# Patient Record
Sex: Male | Born: 1990 | Race: Black or African American | Hispanic: No | Smoking: Never smoker
Health system: Southern US, Community
[De-identification: ages and names within clinical notes are randomized; demographics above are authoritative.]

---

## 2016-05-21 ENCOUNTER — Other Ambulatory Visit: Payer: Self-pay | Admitting: Occupational Medicine

## 2016-05-21 ENCOUNTER — Ambulatory Visit: Payer: Self-pay

## 2016-05-21 DIAGNOSIS — Z021 Encounter for pre-employment examination: Secondary | ICD-10-CM

## 2018-06-16 ENCOUNTER — Emergency Department (HOSPITAL_COMMUNITY): Payer: 59

## 2018-06-16 ENCOUNTER — Emergency Department (HOSPITAL_COMMUNITY)
Admission: EM | Admit: 2018-06-16 | Discharge: 2018-06-16 | Disposition: A | Discharge status: Home / Self Care | Payer: 59 | Attending: Emergency Medicine | Admitting: Emergency Medicine

## 2018-06-16 DIAGNOSIS — S61219A Laceration without foreign body of unspecified finger without damage to nail, initial encounter: Secondary | ICD-10-CM

## 2018-06-16 DIAGNOSIS — W260XXA Contact with knife, initial encounter: Secondary | ICD-10-CM | POA: Diagnosis not present

## 2018-06-16 DIAGNOSIS — Y939 Activity, unspecified: Secondary | ICD-10-CM | POA: Insufficient documentation

## 2018-06-16 DIAGNOSIS — Y999 Unspecified external cause status: Secondary | ICD-10-CM | POA: Insufficient documentation

## 2018-06-16 DIAGNOSIS — Y929 Unspecified place or not applicable: Secondary | ICD-10-CM | POA: Diagnosis not present

## 2018-06-16 DIAGNOSIS — S61216A Laceration without foreign body of right little finger without damage to nail, initial encounter: Secondary | ICD-10-CM | POA: Insufficient documentation

## 2018-06-16 MED ORDER — LIDOCAINE HCL 2 % IJ SOLN
5.0000 mL | Freq: Once | INTRAMUSCULAR | Status: AC
  Administered 2018-06-16: 100 mg

## 2018-06-16 MED ORDER — CEPHALEXIN 500 MG PO CAPS: 500.0000 mg | ORAL_CAPSULE | Freq: Four times a day (QID) | ORAL | 0 refills | Status: AC

## 2018-06-16 NOTE — ED Notes (Signed)
Patient verbalizes understanding of discharge instructions. Opportunity for questioning and answers were provided. Armband removed by staff, pt discharged from ED. Dressing applied to sutures prior to pt discharge, instructions and supplies given, and prescription reviewed.

## 2018-06-16 NOTE — ED Provider Notes (Addendum)
[facility] Note   CSN: [id number] Arrival date & time: 06/16/18  1240     History   Chief Complaint Chief Complaint  Patient presents with  . Laceration    HPI Cole Cooke is a 27 y.o. male.  Patient is a 27 year old healthy male without medical problems presenting today with a laceration to his right pinky finger.  Patient is right-handed and states he was cooking and grabbed a knife which cut his finger.  He has no other injury at this time.  He does not take any anticoagulation.  The history is provided by the patient.  Laceration   The incident occurred less than 1 hour ago. The laceration is located on the right hand. The laceration is 2 cm in size. The laceration mechanism was a a clean knife. The pain is at a severity of 2/10. The pain is mild. The pain has been constant since onset. He reports no foreign bodies present. His tetanus status is UTD.    No past medical history on file.  There are no active problems to display for this patient.        Home Medications    Prior to Admission medications   Not on File    Family History No family history on file.  Social History Social History   Tobacco Use  . Smoking status: Not on file  Substance Use Topics  . Alcohol use: Not on file  . Drug use: Not on file     Allergies   Patient has no allergy information on record.   Review of Systems Review of Systems  All other systems reviewed and are negative.    Physical Exam Updated Vital Signs BP 100/79 (BP Location: Right Arm)   Pulse 86   Temp 98.7 F (37.1 C) (Oral)   Resp 16   Ht 5\' 10"  (1.778 m)   Wt 90.7 kg   SpO2 100%   BMI 28.70 kg/m   Physical Exam  Constitutional: He is oriented to person, place, and time. He appears well-developed and well-nourished. No distress.  HENT:  Head: Normocephalic and atraumatic.  Eyes: Pupils are equal, round, and reactive to light. Conjunctivae  are normal.  Cardiovascular: Normal rate.  Pulmonary/Chest: Effort normal.  Musculoskeletal:       Hands: Neurological: He is alert and oriented to person, place, and time.  Skin: Skin is warm and dry. Capillary refill takes less than 2 seconds. No erythema.  Psychiatric: He has a normal mood and affect. His behavior is normal.  Nursing note and vitals reviewed.    ED Treatments / Results  Labs (all labs ordered are listed, but only abnormal results are displayed) Labs Reviewed - No data to display  EKG None  Radiology Dg Finger Little Right  Result Date: 06/16/2018 CLINICAL DATA:  Laceration to mid fifth finger. EXAM: RIGHT LITTLE FINGER 2+V COMPARISON:  None. FINDINGS: Soft tissue changes over the fifth finger compatible recent laceration. No foreign body. No underlying fracture or dislocation. IMPRESSION: Soft tissue laceration fifth finger.  No fracture or foreign body. Electronically Signed   By: Elberta Fortisaniel  Boyle M.D.   On: 06/16/2018 13:40    Procedures Procedures (including critical care time)  LACERATION REPAIR Performed by: [employer]Whitney Kaydra Borgen Authorized by: Gwyneth SproutWhitney Man Bonneau Consent: Verbal consent obtained. Risks and benefits: risks, benefits and alternatives were discussed Consent given by: patient Patient identity confirmed: provided demographic data Prepped and Draped in normal sterile fashion Wound explored  Laceration  Location: right 5th finger  Laceration Length: 3cm  No Foreign Bodies seen or palpated  Anesthesia: digital block infiltration  Local anesthetic: lidocaine 1% without epinephrine  Anesthetic total: 2 ml  Irrigation method: syringe Amount of cleaning: standard  Skin closure: 3.0 vicryl rapide  Number of sutures: 4  Technique: simple interrupted  Patient tolerance: Patient tolerated the procedure well with no immediate complications.   Medications Ordered in ED Medications - No data to display   Initial Impression / Assessment  and Plan / ED Course  I have reviewed the triage vital signs and the nursing notes.  Pertinent labs & imaging results that were available during my care of the patient were reviewed by me and considered in my medical decision making (see chart for details).     Patient who is right-handed presenting with a deep laceration to the fifth digit with evidence of flexor tendon injury.  X-rays pending.  Hand surgery consulted.  1:47 PM X-ray wnl.  Hand surgery evaluated and will plan on repair in the future.  Final Clinical Impressions(s) / ED Diagnoses   Final diagnoses:  Laceration of right little finger without foreign body without damage to nail, initial encounter  Finger laceration involving tendon, initial encounter    ED Discharge Orders         Ordered    cephALEXin (KEFLEX) 500 MG capsule  4 times daily     06/16/18 1522           Gwyneth Sprout, MD 06/16/18 1348    Gwyneth Sprout, MD 06/16/18 [phone]

## 2018-06-16 NOTE — Consult Note (Addendum)
Reason for Consult:Right little finger laceration Referring Physician: W Festus Aloelunkett  Cole Cooke is an 27 y.o. male.  HPI: Cole Cooke was grabbing a knife at work by the blade and cut his right little finger fairly severely. He came to the ED for evaluation and was noted to have a flexion deficit and hand surgery was consulted. He has some N/T in the tip of the finger. 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  No past medical history on file.  No family history on file.  Social History:  has no tobacco, alcohol, and drug history on file.  Allergies: Allergies not on file  Medications: I have reviewed the patient's current medications.  No results found for this or any previous visit (from the past 48 hour(s)).  No results found.  Review of Systems  Constitutional: Negative for weight loss.  HENT: Negative for ear discharge, ear pain, hearing loss and tinnitus.   Eyes: Negative for blurred vision, double vision, photophobia and pain.  Respiratory: Negative for cough, sputum production and shortness of breath.   Cardiovascular: Negative for chest pain.  Gastrointestinal: Negative for abdominal pain, nausea and vomiting.  Genitourinary: Negative for dysuria, flank pain, frequency and urgency.  Musculoskeletal: Positive for joint pain (Right little finger). Negative for back pain, falls, myalgias and neck pain.  Neurological: Negative for dizziness, tingling, sensory change, focal weakness, loss of consciousness and headaches.  Endo/Heme/Allergies: Does not bruise/bleed easily.  Psychiatric/Behavioral: Negative for depression, memory loss and substance abuse. The patient is not nervous/anxious.    Blood pressure 100/79, pulse 86, temperature 98.7 F (37.1 C), temperature source Oral, resp. rate 16, height 5\' 10"  (1.778 m), weight 90.7 kg, SpO2 100 %. Physical Exam  Constitutional: He appears well-developed and well-nourished. No distress.  HENT:  Head: Normocephalic and atraumatic.   Eyes: Conjunctivae are normal. Right eye exhibits no discharge. Left eye exhibits no discharge. No scleral icterus.  Neck: Normal range of motion.  Cardiovascular: Normal rate and regular rhythm.  Respiratory: Effort normal. No respiratory distress.  Musculoskeletal:  Right shoulder, elbow, wrist, digits- Transverse laceration over volar PIP joint of little finger, no DIP flexion, 4/5 PIP flexion, paresthesia radial aspect of distal finger, no instability, no blocks to motion  Sens  Ax/R/M/U intact except as above  Mot   Ax/ R/ PIN/ M/ AIN/ U intact except as above  Rad 2+  Neurological: He is alert.  Skin: Skin is warm and dry. He is not diaphoretic.  Psychiatric: He has a normal mood and affect. His behavior is normal.    Assessment/Plan: Right little finger laceration with flexor tendon, nerve injury -- Dr. Janee Mornhompson to evaluate in the ED.    Freeman CaldronMichael J. Jeffery, PA-C Orthopedic Surgery [phone] 06/16/2018, 1:36 PM   R SF xverse palmar laceration at distal P1, hemostatic.  No active DIP flexion, but can flex PIP to FDS testing.  Normal LT sens ulnarly, but altered radially Only NPO since 11:15 am  Suspect R SF FDP lac and RDN lac.  Recommend 1st aid measures in ED (wound cleansing/irrigation/closure).  Tetanus is UTD.  Recommend PO antibiotics, and my office will contact patient to begin process of seeking W/C approval for surgical repair on Friday.  G/R/O and expected rehab (therapy, etc) reviewed and consent obtained.  Neil Crouchave Menelik Mcfarren, MD Hand Surgery

## 2018-06-16 NOTE — Discharge Instructions (Signed)
Make sure the cut is covered during the day.  Try to keep it dry.  Plan on surgery on Friday

## 2019-11-10 IMAGING — DX DG FINGER LITTLE 2+V*R*
3 series · 3 of 3 positions shown · non-contrast
Comparison: None.

CLINICAL DATA: Laceration to mid fifth finger.

EXAM:
RIGHT LITTLE FINGER 2+V

[finger ap]
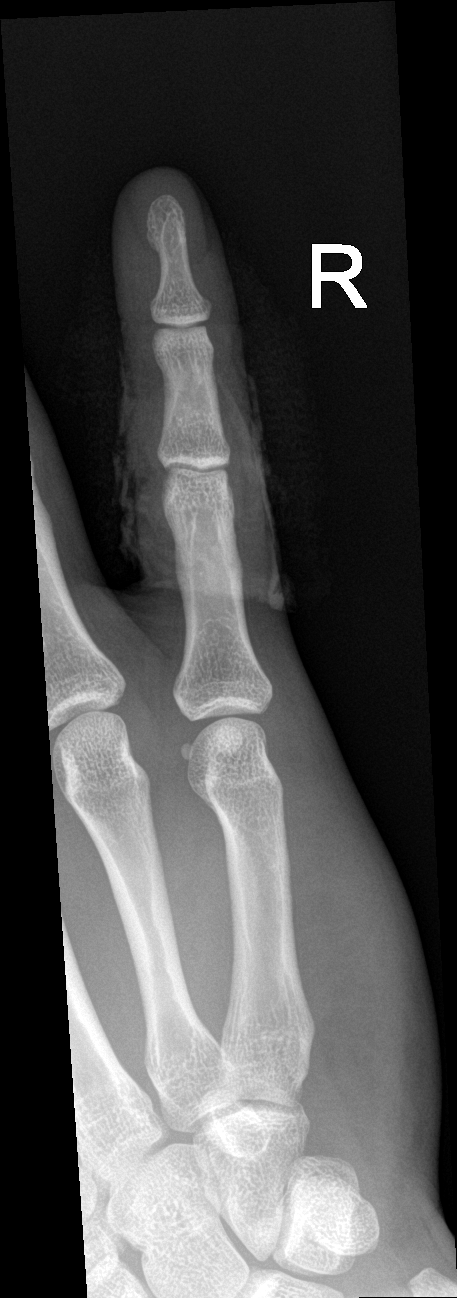

[finger obl]
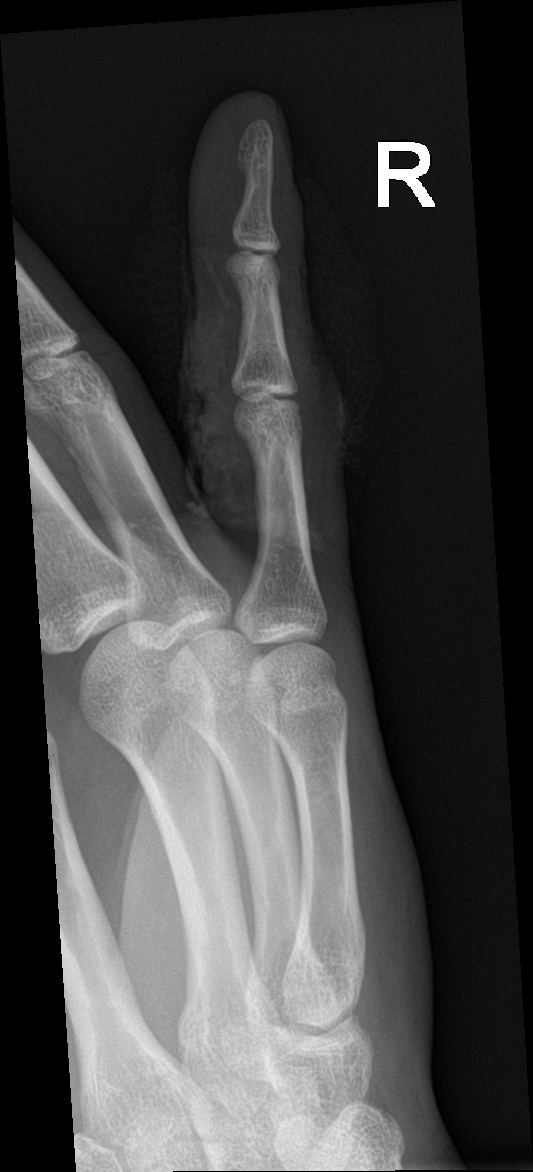

[finger lat]
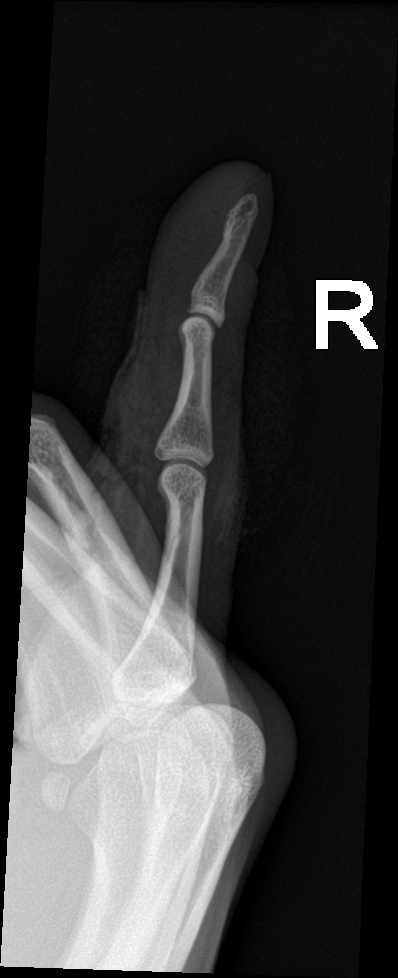

[3 of 3 positions shown; findings below may reference images not displayed]

FINDINGS: Soft tissue changes over the fifth finger compatible recent
laceration. No foreign body. No underlying fracture or dislocation.
IMPRESSION: Soft tissue laceration fifth finger.  No fracture or foreign body.

## 2022-07-28 ENCOUNTER — Encounter (HOSPITAL_COMMUNITY): Payer: Self-pay | Admitting: *Deleted

## 2022-07-28 ENCOUNTER — Ambulatory Visit (HOSPITAL_COMMUNITY)
Admission: EM | Admit: 2022-07-28 | Discharge: 2022-07-28 | Disposition: A | Discharge status: Home / Self Care | Payer: No Typology Code available for payment source | Attending: Emergency Medicine | Admitting: Emergency Medicine

## 2022-07-28 DIAGNOSIS — S0181XA Laceration without foreign body of other part of head, initial encounter: Secondary | ICD-10-CM | POA: Diagnosis not present

## 2022-07-28 NOTE — Discharge Instructions (Signed)
Please monitor for signs of infection such as increased redness, drainage, or increased pain. Please present back to clinic if signs of infections are noticed.   Keep the area dry.

## 2022-07-28 NOTE — ED Triage Notes (Signed)
[employer].    Pt states he was working out at work and fell hit his face on the tile bay floor around 4pm. He now has a laceration over his right eye. He cleaned the wound and applied some tape. He denies any pain or headache, denies LOC.

## 2022-07-28 NOTE — ED Provider Notes (Signed)
[place]    CSN: [id number] Arrival date & time: 07/28/22  [place]      History   Chief Complaint Chief Complaint  Patient presents with   workers comp   Laceration    HPI Cole Cooke is a 32 y.o. male.  Patient presents due to a laceration on his right side of his forehead along his eyebrow that happened at 1600 today.  Patient reports that he fell onto a tile floor while doing [employer].  Patient states that he noticed bleeding upon onset which has now resolved upon arrival to [facility].  Patient reports that the area was cleaned with peroxide and Steri-Strips were used.  Patient denies any headache, loss of consciousness, vision changes, dizziness, lightheadedness, or any systemic symptoms.  Patient reports that his last Tdap injection was 3 years ago.    Laceration   History reviewed. No pertinent past medical history.  There are no problems to display for this patient.   History reviewed. No pertinent surgical history.     Home Medications    Prior to Admission medications   Medication Sig Start Date End Date Taking? Authorizing Provider  cephALEXin (KEFLEX) 500 MG capsule Take 1 capsule (500 mg total) by mouth 4 (four) times daily. 06/16/18   Blanchie Dessert, MD    Family History History reviewed. No pertinent family history.  Social History Social History   Tobacco Use   Smoking status: Never   Smokeless tobacco: Never  Vaping Use   Vaping Use: Never used  Substance Use Topics   Alcohol use: Not Currently   Drug use: Never     Allergies   Sulfa antibiotics   Review of Systems Review of Systems Per HPI  Physical Exam Triage Vital Signs ED Triage Vitals  Enc Vitals Group     BP 07/28/22 1829 119/74     Pulse Rate 07/28/22 1829 95     Resp 07/28/22 1829 18     Temp 07/28/22 1829 98.4 F (36.9 C)     Temp Source 07/28/22 1829 Oral     SpO2 07/28/22 1829 95 %     Weight --      Height --      Head Circumference --      Peak  Flow --      Pain Score 07/28/22 1828 0     Pain Loc --      Pain Edu? --      Excl. in [place]? --    No data found.  Updated Vital Signs BP 119/74 (BP Location: Left Arm)   Pulse 95   Temp 98.4 F (36.9 C) (Oral)   Resp 18   SpO2 95%        Physical Exam Vitals and nursing note reviewed.  Constitutional:      Appearance: Normal appearance.  Skin:    Findings: Laceration present.          Comments: Approximately 5 cm in length within the right eyebrow.  Hemostasis achieved.  Tenderness upon palpation.  Neurological:     Mental Status: He is alert.      UC Treatments / Results  Labs (all labs ordered are listed, but only abnormal results are displayed) Labs Reviewed - No data to display  EKG   Radiology No results found.  Procedures Laceration Repair  Date/Time: 07/28/2022 11:26 PM  Performed by: Flossie Dibble, NP Authorized by: Flossie Dibble, NP   Consent:    Consent obtained:  Verbal  Consent given by:  Patient   Risks, benefits, and alternatives were discussed: yes     Risks discussed:  Pain and infection Universal protocol:    Procedure explained and questions answered to patient or proxy's satisfaction: yes     Patient identity confirmed:  Verbally with patient Anesthesia:    Anesthesia method:  None Laceration details:    Location:  Face   Face location:  R eyebrow   Length (cm):  5   Depth (mm):  0.5 Exploration:    Hemostasis achieved with:  Direct pressure Treatment:    Area cleansed with:  Saline   Amount of cleaning:  Standard Skin repair:    Repair method:  Tissue adhesive Approximation:    Approximation:  Close Repair type:    Repair type:  Simple Post-procedure details:    Dressing:  Open (no dressing)   Procedure completion:  Tolerated well, no immediate complications  (including critical care time)  Medications Ordered in UC Medications - No data to display  Initial Impression / Assessment and Plan / UC Course   I have reviewed the triage vital signs and the nursing notes.  Pertinent labs & imaging results that were available during my care of the patient were reviewed by me and considered in my medical decision making (see chart for details).     Patient was evaluated for laceration to forehead.  Dermabond placed successfully in office.  Patient was made aware of management of site for optimal healing.  Patient was made aware of symptoms that would require immediate follow-up.  Work note was given.  Patient verbalized understanding of instructions.  Charting was provided using a a verbal dictation system, charting was proofread for errors, errors may occur which could change the meaning of the information charted.   Final Clinical Impressions(s) / UC Diagnoses   Final diagnoses:  Laceration of forehead, initial encounter     Discharge Instructions      Please monitor for signs of infection such as increased redness, drainage, or increased pain. Please present back to clinic if signs of infections are noticed.   Keep the area dry.      ED Prescriptions   None    PDMP not reviewed this encounter.   Flossie Dibble, NP 07/28/22 [phone]

## 2023-11-27 ENCOUNTER — Other Ambulatory Visit: Payer: Self-pay | Admitting: Nurse Practitioner

## 2023-11-27 ENCOUNTER — Ambulatory Visit
Admission: RE | Admit: 2023-11-27 | Discharge: 2023-11-27 | Disposition: A | Discharge status: Home / Self Care | Payer: Self-pay | Source: Ambulatory Visit | Attending: Nurse Practitioner | Admitting: Nurse Practitioner

## 2023-11-27 DIAGNOSIS — Z0000 Encounter for general adult medical examination without abnormal findings: Secondary | ICD-10-CM
